# Patient Record
Sex: Male | Born: 1995 | Race: White | Hispanic: Yes | Marital: Single | Smoking: Former smoker
Health system: Southern US, Community
[De-identification: ages and names within clinical notes are randomized; demographics above are authoritative.]

## PROBLEM LIST (undated history)

## (undated) DIAGNOSIS — F429 Obsessive-compulsive disorder, unspecified: Secondary | ICD-10-CM

## (undated) DIAGNOSIS — F419 Anxiety disorder, unspecified: Secondary | ICD-10-CM

---
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
## 2018-01-01 ENCOUNTER — Other Ambulatory Visit: Payer: Self-pay | Admitting: Podiatry

## 2018-01-01 ENCOUNTER — Ambulatory Visit: Payer: BLUE CROSS/BLUE SHIELD | Admitting: Podiatry

## 2018-01-01 ENCOUNTER — Ambulatory Visit (INDEPENDENT_AMBULATORY_CARE_PROVIDER_SITE_OTHER): Payer: BLUE CROSS/BLUE SHIELD

## 2018-01-01 ENCOUNTER — Encounter: Payer: Self-pay | Admitting: Podiatry

## 2018-01-01 VITALS — BP 127/93 | HR 64 | Resp 16

## 2018-01-01 DIAGNOSIS — M25571 Pain in right ankle and joints of right foot: Secondary | ICD-10-CM | POA: Diagnosis not present

## 2018-01-01 DIAGNOSIS — M779 Enthesopathy, unspecified: Secondary | ICD-10-CM

## 2018-01-01 MED ORDER — TRIAMCINOLONE ACETONIDE 10 MG/ML IJ SUSP
10.0000 mg | Freq: Once | INTRAMUSCULAR | Status: AC
  Administered 2018-01-01: 10 mg

## 2018-01-01 NOTE — Progress Notes (Signed)
Subjective:   Patient ID: Adam Vazquez, male   DOB: 22 y.o.   MRN: [id number]   HPI Patient presents stating he has a lot of pain in the outside of the right ankle and he is just moved and is been on his foot for excessive period of time and is started to get sore.  Patient does not smoke and likes to be active   Review of Systems  All other systems reviewed and are negative.       Objective:  Physical Exam  Constitutional: He appears well-developed and well-nourished.  Cardiovascular: Intact distal pulses.  Pulmonary/Chest: Effort normal.  Musculoskeletal: Normal range of motion.  Neurological: He is alert.  Skin: Skin is warm.  Nursing note and vitals reviewed.   Neurovascular status found to be intact muscle strength is adequate range of motion within normal limits with patient noted to have exquisite discomfort in the right sinus tarsi with inflammation within the joint itself with moderate excessive inversion of both feet with ligamentous laxity.  No history of ankle sprain     Assessment:  Probability for sinus tarsitis secondary to excessive activity with ligamentous laxity as possible part of the problem     Plan:  H&P condition reviewed and at this point I did inject the sinus tarsi right 3 mg Kenalog 5 mg Xylocaine and gave instructions for supportive therapy.  Patient may require bracing if symptoms persist but hopefully this will knock his symptoms out and I did place him on oral anti-inflammatory at this time  X-ray indicates no indications of joint damage or indications of arthritis or ligamentous laxity

## 2018-01-01 NOTE — Progress Notes (Signed)
   Subjective:    Patient ID: Adam BurkittLuis Torres-Villa, male    DOB: 07/03/1995, 22 y.o.   MRN: [id number]  HPI    Review of Systems  All other systems reviewed and are negative.      Objective:   Physical Exam        Assessment & Plan:

## 2018-01-12 DIAGNOSIS — F341 Dysthymic disorder: Secondary | ICD-10-CM | POA: Diagnosis not present

## 2018-01-17 DIAGNOSIS — J029 Acute pharyngitis, unspecified: Secondary | ICD-10-CM | POA: Diagnosis not present

## 2018-01-17 DIAGNOSIS — J069 Acute upper respiratory infection, unspecified: Secondary | ICD-10-CM | POA: Diagnosis not present
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
## 2018-04-20 DIAGNOSIS — M25532 Pain in left wrist: Secondary | ICD-10-CM | POA: Diagnosis not present

## 2019-01-27 DIAGNOSIS — F411 Generalized anxiety disorder: Secondary | ICD-10-CM | POA: Diagnosis not present

## 2019-01-27 DIAGNOSIS — F1220 Cannabis dependence, uncomplicated: Secondary | ICD-10-CM | POA: Diagnosis not present

## 2019-01-27 DIAGNOSIS — F331 Major depressive disorder, recurrent, moderate: Secondary | ICD-10-CM | POA: Diagnosis not present

## 2019-02-10 ENCOUNTER — Ambulatory Visit: Payer: Self-pay

## 2019-02-10 ENCOUNTER — Encounter: Payer: Self-pay | Admitting: Orthopaedic Surgery

## 2019-02-10 ENCOUNTER — Ambulatory Visit (INDEPENDENT_AMBULATORY_CARE_PROVIDER_SITE_OTHER): Payer: BC Managed Care – PPO | Admitting: Orthopaedic Surgery

## 2019-02-10 ENCOUNTER — Other Ambulatory Visit: Payer: Self-pay

## 2019-02-10 DIAGNOSIS — M25532 Pain in left wrist: Secondary | ICD-10-CM | POA: Diagnosis not present

## 2019-02-10 MED ORDER — MELOXICAM 15 MG PO TABS: 15.0000 mg | ORAL_TABLET | Freq: Every day | ORAL | 1 refills | Status: DC | PRN
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
## 2019-02-24 DIAGNOSIS — F331 Major depressive disorder, recurrent, moderate: Secondary | ICD-10-CM | POA: Diagnosis not present

## 2019-02-24 DIAGNOSIS — F411 Generalized anxiety disorder: Secondary | ICD-10-CM | POA: Diagnosis not present

## 2019-03-03 DIAGNOSIS — G5603 Carpal tunnel syndrome, bilateral upper limbs: Secondary | ICD-10-CM | POA: Diagnosis not present

## 2019-03-03 DIAGNOSIS — F411 Generalized anxiety disorder: Secondary | ICD-10-CM | POA: Diagnosis not present

## 2019-03-23 DIAGNOSIS — F331 Major depressive disorder, recurrent, moderate: Secondary | ICD-10-CM | POA: Diagnosis not present

## 2019-03-23 DIAGNOSIS — F411 Generalized anxiety disorder: Secondary | ICD-10-CM | POA: Diagnosis not present

## 2019-04-20 DIAGNOSIS — F411 Generalized anxiety disorder: Secondary | ICD-10-CM | POA: Diagnosis not present

## 2019-04-20 DIAGNOSIS — F1220 Cannabis dependence, uncomplicated: Secondary | ICD-10-CM | POA: Diagnosis not present

## 2019-04-20 DIAGNOSIS — F3341 Major depressive disorder, recurrent, in partial remission: Secondary | ICD-10-CM | POA: Diagnosis not present

## 2019-05-13 ENCOUNTER — Ambulatory Visit: Payer: BC Managed Care – PPO | Attending: Family

## 2019-05-13 DIAGNOSIS — Z23 Encounter for immunization: Secondary | ICD-10-CM

## 2019-05-13 NOTE — Progress Notes (Signed)
   Covid-19 Vaccination Clinic  Name:  Atari Novick    MRN: [id number] DOB: 1995-11-24  05/13/2019  Mr. Torres-Villa was observed post Covid-19 immunization for 15 minutes without incident. He was provided with Vaccine Information Sheet and instruction to access the V-Safe system.   Mr. Hoge was instructed to call 911 with any severe reactions post vaccine: Marland Kitchen Difficulty breathing  . Swelling of face and throat  . A fast heartbeat  . A bad rash all over body  . Dizziness and weakness   Immunizations Administered    Name Date Dose VIS Date Route   Moderna COVID-19 Vaccine 05/13/2019 11:28 AM 0.5 mL 01/19/2019 Intramuscular   Manufacturer: Moderna   Lot: [id number]   NDC: 96283-662-94

## 2019-06-03 DIAGNOSIS — F331 Major depressive disorder, recurrent, moderate: Secondary | ICD-10-CM | POA: Diagnosis not present

## 2019-06-03 DIAGNOSIS — F1220 Cannabis dependence, uncomplicated: Secondary | ICD-10-CM | POA: Diagnosis not present

## 2019-06-03 DIAGNOSIS — F411 Generalized anxiety disorder: Secondary | ICD-10-CM | POA: Diagnosis not present

## 2019-06-15 ENCOUNTER — Ambulatory Visit: Payer: BC Managed Care – PPO | Attending: Family

## 2019-06-15 DIAGNOSIS — Z23 Encounter for immunization: Secondary | ICD-10-CM

## 2019-06-15 NOTE — Progress Notes (Signed)
   Covid-19 Vaccination Clinic  Name:  Adam Vazquez    MRN: [id number] DOB: [birth year]  06/15/2019  Mr. Torres-Villa was observed post Covid-19 immunization for 15 minutes without incident. He was provided with Vaccine Information Sheet and instruction to access the V-Safe system.   Mr. Arviso was instructed to call 911 with any severe reactions post vaccine: Marland Kitchen Difficulty breathing  . Swelling of face and throat  . A fast heartbeat  . A bad rash all over body  . Dizziness and weakness   Immunizations Administered    Name Date Dose VIS Date Route   Moderna COVID-19 Vaccine 06/15/2019 10:28 AM 0.5 mL 01/2019 Intramuscular   Manufacturer: Moderna   Lot: [id number]   NDC: 33582-518-98

## 2019-07-02 DIAGNOSIS — F411 Generalized anxiety disorder: Secondary | ICD-10-CM | POA: Diagnosis not present

## 2019-07-02 DIAGNOSIS — F332 Major depressive disorder, recurrent severe without psychotic features: Secondary | ICD-10-CM | POA: Diagnosis not present

## 2019-10-04 ENCOUNTER — Encounter: Payer: Self-pay | Admitting: Physician Assistant

## 2019-10-04 ENCOUNTER — Ambulatory Visit: Payer: BC Managed Care – PPO | Admitting: Physician Assistant

## 2019-10-04 DIAGNOSIS — M7701 Medial epicondylitis, right elbow: Secondary | ICD-10-CM | POA: Diagnosis not present

## 2019-10-04 DIAGNOSIS — M7702 Medial epicondylitis, left elbow: Secondary | ICD-10-CM | POA: Diagnosis not present

## 2019-10-04 NOTE — Progress Notes (Addendum)
Office Visit Note   Patient: Adam Vazquez           Date of Birth: April 04, 1995           MRN: [id number] Visit Date: 10/04/2019              Requested by: No referring provider defined for this encounter. PCP: System, Provider Not In   Assessment & Plan: Visit Diagnoses:  1. Medial epicondylitis of both elbows     Plan: We will send him to physical therapy for modalities and exercises for the bilateral medial epicondylitis.  Also recommend that he try Voltaren gel to the medial aspect of both elbows.  See him back in about 6 weeks to see what type of response he had to therapy. Questions were encouraged and answered at length.  Follow-Up Instructions: No follow-ups on file.   Orders:  No orders of the defined types were placed in this encounter.  No orders of the defined types were placed in this encounter.     Procedures: No procedures performed   Clinical Data: No additional findings.   Subjective: Chief Complaint  Patient presents with  . Left Wrist - Pain  . Right Wrist - Pain  . Right Elbow - Pain  . Left Elbow - Pain    HPI Adam Vazquez is a 24 year old male comes in today with bilateral elbow and wrist pain.  He states that the numbness tingling in his left hand is greatly dissipated and really is not there anymore.  He does have sharp pains in his left hands occasionally with pins and needle sensation particularly involving thumb.  He has had  left elbow pain medial aspect and to a lesser degree in the right medial elbow.  He has had no known injury.  He is taking no medications for this.  He does play musical instruments daily and is tried to cut back on the amount of playing that he does.  He notes that he has wrist pain when playing the piano for long periods of time.  He is also having left second metacarpal pain at times.  Review of Systems Negative for fevers or chills.  Objective: Vital Signs: There were no vitals taken for this visit.  Physical  Exam General: Well-developed well-nourished male in no acute distress. Vascular: Radial pulses are 2+ and equal symmetric. Neurologic: Bilateral hands full sensation throughout the right hand to light touch subjective decreased sensation throughout the median distribution of the left hand.  Full motor bilateral hands. Ortho Exam Bilateral elbows full range of motion without pain.  He has tenderness over the medial epicondyle region of both elbows with negative provocative test bilaterally.  There is no rash skin lesion ulcerations.  Negative Tinel's on the left positive on the right over the median nerve at the wrist.  Compression tenderness positive at the wrist over the median nerve on the right only.  Phalen's test is negative bilaterally.  Specialty Comments:  No specialty comments available.  Imaging: No results found.   PMFS History: There are no problems to display for this patient.  History reviewed. No pertinent past medical history.  History reviewed. No pertinent family history.  History reviewed. No pertinent surgical history. Social History   Occupational History  . Not on file  Tobacco Use  . Smoking status: Former Games developer  . Smokeless tobacco: Never Used  Substance and Sexual Activity  . Alcohol use: No  . Drug use: No  . Sexual activity: Not on  file

## 2019-11-03 DIAGNOSIS — Z130 Encounter for screening for diseases of the blood and blood-forming organs and certain disorders involving the immune mechanism: Secondary | ICD-10-CM | POA: Diagnosis not present

## 2019-11-03 DIAGNOSIS — R718 Other abnormality of red blood cells: Secondary | ICD-10-CM | POA: Diagnosis not present

## 2019-11-03 DIAGNOSIS — Z0000 Encounter for general adult medical examination without abnormal findings: Secondary | ICD-10-CM | POA: Diagnosis not present

## 2019-11-03 DIAGNOSIS — Z0001 Encounter for general adult medical examination with abnormal findings: Secondary | ICD-10-CM | POA: Diagnosis not present

## 2019-11-03 DIAGNOSIS — F329 Major depressive disorder, single episode, unspecified: Secondary | ICD-10-CM | POA: Diagnosis not present

## 2019-11-03 DIAGNOSIS — Z1329 Encounter for screening for other suspected endocrine disorder: Secondary | ICD-10-CM | POA: Diagnosis not present

## 2019-11-03 DIAGNOSIS — F419 Anxiety disorder, unspecified: Secondary | ICD-10-CM | POA: Diagnosis not present

## 2019-11-03 DIAGNOSIS — F410 Panic disorder [episodic paroxysmal anxiety] without agoraphobia: Secondary | ICD-10-CM | POA: Diagnosis not present

## 2019-11-03 DIAGNOSIS — G5602 Carpal tunnel syndrome, left upper limb: Secondary | ICD-10-CM | POA: Diagnosis not present

## 2019-11-15 ENCOUNTER — Ambulatory Visit: Payer: BC Managed Care – PPO | Admitting: Physician Assistant

## 2020-01-11 ENCOUNTER — Ambulatory Visit: Payer: Self-pay

## 2020-01-11 ENCOUNTER — Ambulatory Visit: Payer: BC Managed Care – PPO

## 2020-02-10 DIAGNOSIS — H9203 Otalgia, bilateral: Secondary | ICD-10-CM | POA: Diagnosis not present

## 2020-02-10 DIAGNOSIS — H6983 Other specified disorders of Eustachian tube, bilateral: Secondary | ICD-10-CM | POA: Diagnosis not present

## 2020-04-03 DIAGNOSIS — H93233 Hyperacusis, bilateral: Secondary | ICD-10-CM | POA: Diagnosis not present

## 2020-04-03 DIAGNOSIS — H6123 Impacted cerumen, bilateral: Secondary | ICD-10-CM | POA: Diagnosis not present

## 2020-04-03 DIAGNOSIS — H6983 Other specified disorders of Eustachian tube, bilateral: Secondary | ICD-10-CM | POA: Diagnosis not present

## 2020-04-28 DIAGNOSIS — R590 Localized enlarged lymph nodes: Secondary | ICD-10-CM | POA: Diagnosis not present

## 2020-04-28 DIAGNOSIS — F419 Anxiety disorder, unspecified: Secondary | ICD-10-CM | POA: Diagnosis not present

## 2020-04-28 DIAGNOSIS — R21 Rash and other nonspecific skin eruption: Secondary | ICD-10-CM | POA: Diagnosis not present

## 2020-04-28 DIAGNOSIS — H6983 Other specified disorders of Eustachian tube, bilateral: Secondary | ICD-10-CM | POA: Diagnosis not present

## 2020-05-08 DIAGNOSIS — F419 Anxiety disorder, unspecified: Secondary | ICD-10-CM | POA: Diagnosis not present

## 2020-05-08 DIAGNOSIS — H6983 Other specified disorders of Eustachian tube, bilateral: Secondary | ICD-10-CM | POA: Diagnosis not present

## 2020-05-08 DIAGNOSIS — R590 Localized enlarged lymph nodes: Secondary | ICD-10-CM | POA: Diagnosis not present

## 2020-05-08 DIAGNOSIS — R21 Rash and other nonspecific skin eruption: Secondary | ICD-10-CM | POA: Diagnosis not present

## 2020-05-08 DIAGNOSIS — F331 Major depressive disorder, recurrent, moderate: Secondary | ICD-10-CM | POA: Diagnosis not present

## 2020-05-23 DIAGNOSIS — H6123 Impacted cerumen, bilateral: Secondary | ICD-10-CM | POA: Diagnosis not present

## 2020-05-23 DIAGNOSIS — H938X3 Other specified disorders of ear, bilateral: Secondary | ICD-10-CM | POA: Diagnosis not present

## 2020-05-23 DIAGNOSIS — F419 Anxiety disorder, unspecified: Secondary | ICD-10-CM | POA: Diagnosis not present

## 2020-05-23 DIAGNOSIS — H9203 Otalgia, bilateral: Secondary | ICD-10-CM | POA: Diagnosis not present

## 2020-05-23 DIAGNOSIS — H9313 Tinnitus, bilateral: Secondary | ICD-10-CM | POA: Diagnosis not present

## 2020-08-09 DIAGNOSIS — H9203 Otalgia, bilateral: Secondary | ICD-10-CM | POA: Diagnosis not present

## 2020-08-09 DIAGNOSIS — H608X3 Other otitis externa, bilateral: Secondary | ICD-10-CM | POA: Diagnosis not present

## 2020-08-31 DIAGNOSIS — H9203 Otalgia, bilateral: Secondary | ICD-10-CM | POA: Diagnosis not present

## 2020-08-31 DIAGNOSIS — H608X3 Other otitis externa, bilateral: Secondary | ICD-10-CM | POA: Diagnosis not present

## 2020-09-11 DIAGNOSIS — H9203 Otalgia, bilateral: Secondary | ICD-10-CM | POA: Diagnosis not present

## 2020-09-11 DIAGNOSIS — H9313 Tinnitus, bilateral: Secondary | ICD-10-CM | POA: Diagnosis not present

## 2020-11-14 ENCOUNTER — Ambulatory Visit: Payer: BLUE CROSS/BLUE SHIELD | Admitting: Behavioral Health

## 2020-11-21 DIAGNOSIS — F3341 Major depressive disorder, recurrent, in partial remission: Secondary | ICD-10-CM | POA: Diagnosis not present

## 2020-11-21 DIAGNOSIS — F411 Generalized anxiety disorder: Secondary | ICD-10-CM | POA: Diagnosis not present

## 2020-11-21 DIAGNOSIS — R591 Generalized enlarged lymph nodes: Secondary | ICD-10-CM | POA: Diagnosis not present

## 2020-12-01 DIAGNOSIS — N50811 Right testicular pain: Secondary | ICD-10-CM | POA: Diagnosis not present

## 2020-12-01 DIAGNOSIS — Z681 Body mass index (BMI) 19 or less, adult: Secondary | ICD-10-CM | POA: Diagnosis not present

## 2020-12-03 ENCOUNTER — Emergency Department (HOSPITAL_BASED_OUTPATIENT_CLINIC_OR_DEPARTMENT_OTHER): Payer: BC Managed Care – PPO

## 2020-12-03 ENCOUNTER — Emergency Department (HOSPITAL_BASED_OUTPATIENT_CLINIC_OR_DEPARTMENT_OTHER)
Admission: EM | Admit: 2020-12-03 | Discharge: 2020-12-03 | Disposition: A | Discharge status: Home / Self Care | Payer: BC Managed Care – PPO | Attending: Emergency Medicine | Admitting: Emergency Medicine

## 2020-12-03 ENCOUNTER — Other Ambulatory Visit: Payer: Self-pay

## 2020-12-03 ENCOUNTER — Encounter (HOSPITAL_BASED_OUTPATIENT_CLINIC_OR_DEPARTMENT_OTHER): Payer: Self-pay | Admitting: Obstetrics and Gynecology

## 2020-12-03 DIAGNOSIS — Z87891 Personal history of nicotine dependence: Secondary | ICD-10-CM | POA: Diagnosis not present

## 2020-12-03 DIAGNOSIS — N433 Hydrocele, unspecified: Secondary | ICD-10-CM | POA: Insufficient documentation

## 2020-12-03 DIAGNOSIS — F411 Generalized anxiety disorder: Secondary | ICD-10-CM | POA: Diagnosis not present

## 2020-12-03 DIAGNOSIS — R1030 Lower abdominal pain, unspecified: Secondary | ICD-10-CM | POA: Diagnosis not present

## 2020-12-03 DIAGNOSIS — E876 Hypokalemia: Secondary | ICD-10-CM | POA: Diagnosis not present

## 2020-12-03 DIAGNOSIS — N50811 Right testicular pain: Secondary | ICD-10-CM | POA: Diagnosis not present

## 2020-12-03 DIAGNOSIS — R1031 Right lower quadrant pain: Secondary | ICD-10-CM

## 2020-12-03 DIAGNOSIS — M62838 Other muscle spasm: Secondary | ICD-10-CM | POA: Diagnosis not present

## 2020-12-03 DIAGNOSIS — Z681 Body mass index (BMI) 19 or less, adult: Secondary | ICD-10-CM | POA: Diagnosis not present

## 2020-12-03 DIAGNOSIS — R109 Unspecified abdominal pain: Secondary | ICD-10-CM | POA: Diagnosis not present

## 2020-12-03 HISTORY — DX: Obsessive-compulsive disorder, unspecified: F42.9

## 2020-12-03 HISTORY — DX: Anxiety disorder, unspecified: F41.9

## 2020-12-03 LAB — CBC WITH DIFFERENTIAL/PLATELET
Abs Immature Granulocytes: 0.02 10*3/uL (ref 0.00–0.07)
Basophils Absolute: 0.1 10*3/uL (ref 0.0–0.1)
Basophils Relative: 1 %
Eosinophils Absolute: 0.2 10*3/uL (ref 0.0–0.5)
Eosinophils Relative: 3 %
HCT: 45.9 % (ref 39.0–52.0)
Hemoglobin: 15.1 g/dL (ref 13.0–17.0)
Immature Granulocytes: 0 %
Lymphocytes Relative: 28 %
Lymphs Abs: 2.4 10*3/uL (ref 0.7–4.0)
MCH: 25.8 pg — ABNORMAL LOW (ref 26.0–34.0)
MCHC: 32.9 g/dL (ref 30.0–36.0)
MCV: 78.3 fL — ABNORMAL LOW (ref 80.0–100.0)
Monocytes Absolute: 0.6 10*3/uL (ref 0.1–1.0)
Monocytes Relative: 7 %
Neutro Abs: 5.1 10*3/uL (ref 1.7–7.7)
Neutrophils Relative %: 61 %
Platelets: 189 10*3/uL (ref 150–400)
RBC: 5.86 MIL/uL — ABNORMAL HIGH (ref 4.22–5.81)
RDW: 13.2 % (ref 11.5–15.5)
WBC: 8.4 10*3/uL (ref 4.0–10.5)
nRBC: 0 % (ref 0.0–0.2)

## 2020-12-03 LAB — COMPREHENSIVE METABOLIC PANEL
ALT: 11 U/L (ref 0–44)
AST: 18 U/L (ref 15–41)
Albumin: 5.1 g/dL — ABNORMAL HIGH (ref 3.5–5.0)
Alkaline Phosphatase: 45 U/L (ref 38–126)
Anion gap: 12 (ref 5–15)
BUN: 14 mg/dL (ref 6–20)
CO2: 24 mmol/L (ref 22–32)
Calcium: 9.9 mg/dL (ref 8.9–10.3)
Chloride: 105 mmol/L (ref 98–111)
Creatinine, Ser: 0.8 mg/dL (ref 0.61–1.24)
GFR, Estimated: >60 mL/min (ref >60)
Glucose, Bld: 99 mg/dL (ref 70–99)
Potassium: 3.4 mmol/L — ABNORMAL LOW (ref 3.5–5.1)
Sodium: 141 mmol/L (ref 135–145)
Total Bilirubin: 0.5 mg/dL (ref 0.3–1.2)
Total Protein: 7.6 g/dL (ref 6.5–8.1)

## 2020-12-03 LAB — URINALYSIS, ROUTINE W REFLEX MICROSCOPIC
Bilirubin Urine: NEGATIVE
Glucose, UA: NEGATIVE mg/dL
Hgb urine dipstick: NEGATIVE
Ketones, ur: NEGATIVE mg/dL
Leukocytes,Ua: NEGATIVE
Nitrite: NEGATIVE
Protein, ur: NEGATIVE mg/dL
Specific Gravity, Urine: 1.018 (ref 1.005–1.030)
pH: 8.5 — ABNORMAL HIGH (ref 5.0–8.0)

## 2020-12-03 MED ORDER — IOHEXOL 300 MG/ML  SOLN
100.0000 mL | Freq: Once | INTRAMUSCULAR | Status: AC | PRN
  Administered 2020-12-03: 100 mL via INTRAVENOUS

## 2020-12-03 NOTE — ED Provider Notes (Signed)
[facility] EMERGENCY DEPT Provider Note   CSN: [id number] Arrival date & time: 12/03/20  1355     History Chief Complaint  Patient presents with   Groin Pain    Adam Vazquez is a 25 y.o. male.  The history is provided by the patient and medical records. No language interpreter was used.  Groin Pain This is a new problem. The current episode started more than 2 days ago. The problem occurs constantly. The problem has not changed since onset.Associated symptoms include abdominal pain. Pertinent negatives include no chest pain, no headaches and no shortness of breath. The symptoms are aggravated by bending (mvoving). Nothing relieves the symptoms. He has tried nothing for the symptoms. The treatment provided no relief.      Past Medical History:  Diagnosis Date   Anxiety    OCD (obsessive compulsive disorder)     There are no problems to display for this patient.   History reviewed. No pertinent surgical history.     No family history on file.  Social History   Tobacco Use   Smoking status: Former   Smokeless tobacco: Never  Building services engineer Use: Never used  Substance Use Topics   Alcohol use: No   Drug use: Yes    Types: Marijuana    Home Medications Prior to Admission medications   Medication Sig Start Date End Date Taking? Authorizing Provider  sertraline (ZOLOFT) 100 MG tablet Take 100 mg by mouth daily. 06/02/19   [provider]    Allergies    Patient has no known allergies.  Review of Systems   Review of Systems  Constitutional:  Negative for chills, diaphoresis, fatigue and fever.  HENT:  Negative for congestion.   Respiratory:  Negative for cough, chest tightness, shortness of breath and wheezing.   Cardiovascular:  Negative for chest pain, palpitations and leg swelling.  Gastrointestinal:  Positive for abdominal pain. Negative for constipation, diarrhea, nausea and vomiting.  Genitourinary:  Positive  for testicular pain. Negative for decreased urine volume, dysuria, flank pain, penile discharge, penile pain, penile swelling and scrotal swelling.  Musculoskeletal:  Negative for back pain, neck pain and neck stiffness.  Skin:  Negative for rash and wound.  Neurological:  Negative for headaches.  Psychiatric/Behavioral:  Negative for agitation and confusion.   All other systems reviewed and are negative.  Physical Exam Updated Vital Signs BP 130/71 (BP Location: Right Arm)   Pulse (!) 115   Temp 98.7 F (37.1 C)   Resp 16   SpO2 100%   Physical Exam Vitals and nursing note reviewed. Exam conducted with a chaperone present.  Constitutional:      General: He is not in acute distress.    Appearance: He is well-developed. He is not ill-appearing, toxic-appearing or diaphoretic.  HENT:     Head: Normocephalic and atraumatic.     Nose: No congestion or rhinorrhea.  Eyes:     Conjunctiva/sclera: Conjunctivae normal.  Cardiovascular:     Rate and Rhythm: Normal rate and regular rhythm.     Heart sounds: No murmur heard. Pulmonary:     Effort: Pulmonary effort is normal. No respiratory distress.     Breath sounds: Normal breath sounds. No wheezing, rhonchi or rales.  Chest:     Chest wall: No tenderness.  Abdominal:     General: Abdomen is flat.     Palpations: Abdomen is soft.     Tenderness: There is abdominal tenderness. There is no right  CVA tenderness, left CVA tenderness, guarding or rebound.  Genitourinary:    Testes:        Right: Tenderness present.        Left: Tenderness not present.       Comments: Patient has some right inguinal tenderness but no evidence of hernia on exam.  Some tenderness in the right scrotum but no erythema or warmth.  Normal mobility of the testicles.  Some tenderness in the right lower quadrant. Musculoskeletal:        General: No tenderness.     Cervical back: Neck supple. No tenderness.     Right lower leg: No edema.     Left lower leg: No  edema.  Skin:    General: Skin is warm and dry.     Capillary Refill: Capillary refill takes less than 2 seconds.     Findings: No erythema.  Neurological:     General: No focal deficit present.     Mental Status: He is alert.     Sensory: No sensory deficit.     Motor: No weakness.    ED Results / Procedures / Treatments   Labs (all labs ordered are listed, but only abnormal results are displayed) Labs Reviewed  URINALYSIS, ROUTINE W REFLEX MICROSCOPIC - Abnormal; Notable for the following components:      Result Value   pH 8.5 (*)    All other components within normal limits  CBC WITH DIFFERENTIAL/PLATELET - Abnormal; Notable for the following components:   RBC 5.86 (*)    MCV 78.3 (*)    MCH 25.8 (*)    All other components within normal limits  COMPREHENSIVE METABOLIC PANEL - Abnormal; Notable for the following components:   Potassium 3.4 (*)    Albumin 5.1 (*)    All other components within normal limits    EKG None  Radiology CT ABDOMEN PELVIS W CONTRAST  Result Date: 12/03/2020 CLINICAL DATA:  RLQ abdominal pain Right groin and right lower quadrant abdominal pain. Ultrasound showed no torsion but mild hydroceles. Family concerned about appendicitis. EXAM: CT ABDOMEN AND PELVIS WITH CONTRAST TECHNIQUE: Multidetector CT imaging of the abdomen and pelvis was performed using the standard protocol following bolus administration of intravenous contrast. CONTRAST:  OMNIPAQUE IOHEXOL 300 MG/ML  SOLN COMPARISON:  None. FINDINGS: Lower chest: Included lung bases are clear.  Heart size is normal. Hepatobiliary: No focal liver abnormality is seen. No gallstones, gallbladder wall thickening, or biliary dilatation. Pancreas: Unremarkable. No pancreatic ductal dilatation or surrounding inflammatory changes. Spleen: Normal in size without focal abnormality. Adrenals/Urinary Tract: Unremarkable adrenal glands. Kidneys enhance symmetrically without focal lesion, stone, or  hydronephrosis. Ureters are nondilated. Urinary bladder appears unremarkable. Stomach/Bowel: Stomach is within normal limits. Appendix appears normal (series 5, images 45-47). No evidence of bowel wall thickening, distention, or inflammatory changes. Vascular/Lymphatic: No significant vascular findings are present. No enlarged abdominal or pelvic lymph nodes. Reproductive: Prostate is unremarkable. Other: No free fluid. No abdominopelvic fluid collection. No pneumoperitoneum. No abdominal wall hernia. Musculoskeletal: Incidentally noted benign bone islands within the left femoral neck and right sacrum. Bony structures are otherwise within normal limits. No acute fracture or malalignment. No suspicious bone lesion. IMPRESSION: No acute abdominopelvic findings. Normal appendix. Electronically Signed   By: Duanne Guess D.O.   On: 12/03/2020 17:28   US SCROTUM W/DOPPLER  Result Date: 12/03/2020 CLINICAL DATA:  Right inguinal pain. EXAM: SCROTAL ULTRASOUND DOPPLER ULTRASOUND OF THE TESTICLES TECHNIQUE: Complete ultrasound examination of the testicles, epididymis, and  other scrotal structures was performed. Color and spectral Doppler ultrasound were also utilized to evaluate blood flow to the testicles. COMPARISON:  None. FINDINGS: Right testicle Measurements: 4.7 x 2.1 x 3.5 cm. No mass or microlithiasis visualized. Left testicle Measurements: 4.2 x 1.6 x 2.9 cm. No mass or microlithiasis visualized. Right epididymis:  Normal in size and appearance. Left epididymis:  Normal in size and appearance. Hydrocele:  Trace bilateral hydroceles. Varicocele:  None visualized. Pulsed Doppler interrogation of both testes demonstrates normal low resistance arterial and venous waveforms bilaterally. Targeted ultrasound performed of the right inguinal canal at the site of pain demonstrates no definite abnormality. IMPRESSION: Trace bilateral hydroceles. Otherwise unremarkable scrotal ultrasound. Electronically Signed   By:  Emmaline Kluver M.D.   On: 12/03/2020 14:48    Procedures Procedures   Medications Ordered in ED Medications  iohexol (OMNIPAQUE) 300 MG/ML solution 100 mL (100 mLs Intravenous Contrast Given 12/03/20 1704)    ED Course  I have reviewed the triage vital signs and the nursing notes.  Pertinent labs & imaging results that were available during my care of the patient were reviewed by me and considered in my medical decision making (see chart for details).    MDM Rules/Calculators/A&P                           Adam Vazquez is a 26 y.o. male with a past medical history significant for anxiety and documented obsessive-compulsive disorder who presents with groin pain.  Patient reports that he started having right-sided inguinal and groin pain 5 days ago after masturbating 3 times and 6 hours.  He reports this is more than normal for him.  He denies any dysuria or hematuria and has never had this pain before.  He reports pain in his right testicle, right inguinal area, and right lower quadrant.  He has no history of trauma or hernias.  No rashes or skin changes reported.  No fevers, chills, chest pain, shortness breath, nausea, vomiting, constipation, or diarrhea.  Pain is moderate to severe especially when he moves or bends differently.  On exam, lungs clear and chest nontender.  Abdomen was tender in the right lower quadrant and right inguinal area.  I did not appreciate a hernia when examined with a chaperone.  Mild testicular tenderness on the right side but no warmth.  Normal mobility of the testicle.  Penis was uncircumcised and nontender.  Exam otherwise unremarkable.  Patient had an ultrasound which did not show torsion.  Small hydrocele seen.  Otherwise due to the right lower quadrant tenderness we agreed to get a CT scan to look for hernia missed on ultrasound or appendicitis.  Labs only showed mild hypokalemia was otherwise reassuring.  CT scan did not show  appendicitis or other abnormality.  Suspect that the patient's masturbatory episodes on the day the discomfort started may have led to the hydrocele.  This may also be causing some of the discomfort.  Patient will follow-up with urology and observe conservative management.  He agrees with plan of care and discharge and was discharged in good condition.   Final Clinical Impression(s) / ED Diagnoses Final diagnoses:  Pain in right testicle  Hydrocele, unspecified hydrocele type  Right inguinal pain    Rx / DC Orders ED Discharge Orders     None       Clinical Impression: 1. Hydrocele, unspecified hydrocele type   2. Pain in right testicle   3.  Right inguinal pain     Disposition: Discharge  Condition: Good  I have discussed the results, Dx and Tx plan with the pt(& family if present). He/she/they expressed understanding and agree(s) with the plan. Discharge instructions discussed at great length. Strict return precautions discussed and pt &/or family have verbalized understanding of the instructions. No further questions at time of discharge.    New Prescriptions   No medications on file    Follow Up: [facility] [address] [place] [address] [phone]       Audrea Bolte, Canary Brim, MD 12/03/20 [phone]

## 2020-12-03 NOTE — ED Notes (Signed)
Dc instructions reviewed with patient , voiced understanding.

## 2020-12-03 NOTE — Discharge Instructions (Addendum)
Your history, exam, work-up today are consistent with hydrocele causing pain in your groin.  There is no evidence of hernia, torsion, infection, or appendicitis on the ultrasound and CT scan.  Your labs are otherwise reassuring as we discussed.  Please follow-up with urology and PCP.  If any symptoms change or worsen, please return to the nearest emergency department.

## 2020-12-03 NOTE — ED Notes (Signed)
RT placed PIV in right [facility] without difficulty. Blood collection performed at this time for labs. PIC secured, flushed, and saline locked.

## 2020-12-03 NOTE — ED Triage Notes (Signed)
Patient reports to the ER for groin pain, and reports pain in the right testicle. Patient states he has not had sex in x4 years. Patient reports he had not masturbated in x4 days and then masturbated x3 times in under 6 hours. Patient reports he also worked out and did leg workouts and since then has developed pain in his right groin. Patient also reports some nausea.

## 2020-12-15 DIAGNOSIS — N433 Hydrocele, unspecified: Secondary | ICD-10-CM | POA: Diagnosis not present

## 2020-12-15 DIAGNOSIS — R1031 Right lower quadrant pain: Secondary | ICD-10-CM | POA: Diagnosis not present

## 2020-12-15 DIAGNOSIS — F32A Depression, unspecified: Secondary | ICD-10-CM | POA: Diagnosis not present

## 2020-12-20 DIAGNOSIS — M79604 Pain in right leg: Secondary | ICD-10-CM | POA: Diagnosis not present

## 2020-12-20 DIAGNOSIS — N432 Other hydrocele: Secondary | ICD-10-CM | POA: Diagnosis not present

## 2020-12-20 DIAGNOSIS — R1031 Right lower quadrant pain: Secondary | ICD-10-CM | POA: Diagnosis not present

## 2021-01-18 DIAGNOSIS — F411 Generalized anxiety disorder: Secondary | ICD-10-CM | POA: Diagnosis not present

## 2021-01-18 DIAGNOSIS — F3341 Major depressive disorder, recurrent, in partial remission: Secondary | ICD-10-CM | POA: Diagnosis not present

## 2021-01-18 DIAGNOSIS — J320 Chronic maxillary sinusitis: Secondary | ICD-10-CM | POA: Diagnosis not present

## 2021-01-23 DIAGNOSIS — H93293 Other abnormal auditory perceptions, bilateral: Secondary | ICD-10-CM | POA: Diagnosis not present

## 2021-02-20 DIAGNOSIS — J309 Allergic rhinitis, unspecified: Secondary | ICD-10-CM | POA: Diagnosis not present

## 2021-04-04 DIAGNOSIS — R1031 Right lower quadrant pain: Secondary | ICD-10-CM | POA: Diagnosis not present

## 2021-04-04 DIAGNOSIS — N433 Hydrocele, unspecified: Secondary | ICD-10-CM | POA: Diagnosis not present

## 2021-04-04 DIAGNOSIS — M79604 Pain in right leg: Secondary | ICD-10-CM | POA: Diagnosis not present

## 2021-04-04 DIAGNOSIS — N5312 Painful ejaculation: Secondary | ICD-10-CM | POA: Diagnosis not present

## 2021-04-05 DIAGNOSIS — M25552 Pain in left hip: Secondary | ICD-10-CM | POA: Diagnosis not present

## 2021-04-05 DIAGNOSIS — M25541 Pain in joints of right hand: Secondary | ICD-10-CM | POA: Diagnosis not present

## 2021-04-05 DIAGNOSIS — Z6820 Body mass index (BMI) 20.0-20.9, adult: Secondary | ICD-10-CM | POA: Diagnosis not present

## 2021-04-05 DIAGNOSIS — M545 Low back pain, unspecified: Secondary | ICD-10-CM | POA: Diagnosis not present

## 2021-04-30 DIAGNOSIS — F1220 Cannabis dependence, uncomplicated: Secondary | ICD-10-CM | POA: Diagnosis not present

## 2021-04-30 DIAGNOSIS — F32A Depression, unspecified: Secondary | ICD-10-CM | POA: Diagnosis not present

## 2021-05-02 DIAGNOSIS — F411 Generalized anxiety disorder: Secondary | ICD-10-CM | POA: Diagnosis not present

## 2021-05-02 DIAGNOSIS — F331 Major depressive disorder, recurrent, moderate: Secondary | ICD-10-CM | POA: Diagnosis not present

## 2021-05-02 DIAGNOSIS — F429 Obsessive-compulsive disorder, unspecified: Secondary | ICD-10-CM | POA: Diagnosis not present

## 2021-05-02 DIAGNOSIS — F1220 Cannabis dependence, uncomplicated: Secondary | ICD-10-CM | POA: Diagnosis not present

## 2021-06-04 DIAGNOSIS — R102 Pelvic and perineal pain: Secondary | ICD-10-CM | POA: Diagnosis not present

## 2021-06-04 DIAGNOSIS — N4281 Prostatodynia syndrome: Secondary | ICD-10-CM | POA: Diagnosis not present

## 2021-08-10 DIAGNOSIS — M6289 Other specified disorders of muscle: Secondary | ICD-10-CM | POA: Diagnosis not present

## 2021-08-10 DIAGNOSIS — R300 Dysuria: Secondary | ICD-10-CM | POA: Diagnosis not present

## 2021-08-10 DIAGNOSIS — F419 Anxiety disorder, unspecified: Secondary | ICD-10-CM | POA: Diagnosis not present

## 2021-09-25 DIAGNOSIS — K5900 Constipation, unspecified: Secondary | ICD-10-CM | POA: Diagnosis not present

## 2021-09-25 DIAGNOSIS — R1030 Lower abdominal pain, unspecified: Secondary | ICD-10-CM | POA: Diagnosis not present

## 2021-09-27 DIAGNOSIS — R102 Pelvic and perineal pain: Secondary | ICD-10-CM | POA: Diagnosis not present

## 2021-09-27 DIAGNOSIS — N4281 Prostatodynia syndrome: Secondary | ICD-10-CM | POA: Diagnosis not present

## 2021-10-04 DIAGNOSIS — S300XXA Contusion of lower back and pelvis, initial encounter: Secondary | ICD-10-CM | POA: Diagnosis not present

## 2022-01-15 DIAGNOSIS — F1220 Cannabis dependence, uncomplicated: Secondary | ICD-10-CM | POA: Diagnosis not present

## 2022-01-15 DIAGNOSIS — F411 Generalized anxiety disorder: Secondary | ICD-10-CM | POA: Diagnosis not present

## 2022-01-15 DIAGNOSIS — R002 Palpitations: Secondary | ICD-10-CM | POA: Diagnosis not present

## 2022-01-22 ENCOUNTER — Ambulatory Visit: Payer: Self-pay | Admitting: *Deleted

## 2022-01-22 DIAGNOSIS — M25552 Pain in left hip: Secondary | ICD-10-CM | POA: Diagnosis not present

## 2022-01-22 NOTE — Telephone Encounter (Signed)
  Chief Complaint: forceful heartbeats- feels like a hammer Symptoms: heart beat is forceful- HR 50, patient is on propranolol and hydroxyzine for heart rate control, patient does suffer from anxiety and panic attacks with physical symptoms  Frequency: 1-2 weeks Pertinent Negatives: Patient denies chest pain, SOB Disposition: [] ED /[] Urgent Care (no appt availability in office) / [] Appointment(In office/virtual)/ []  [place] [facility]/ [] Home Care/ [] Refused Recommended Disposition /[] [place] [facility]/ [x]  Follow-up with PCP Additional Notes: Patient does have appointment with PCP tomorrow- he wants to be seen today- he is going to ED   Reason for Disposition  [1] Palpitations AND [2] no improvement after using Care Advice  Answer Assessment - Initial Assessment Questions 1. DESCRIPTION: "Please describe your heart rate or heartbeat that you are having" (e.g., fast/slow, regular/irregular, skipped or extra beats, "palpitations")     " Heavy heartbeat"- hammer feeling, tremor 2. ONSET: "When did it start?" (Minutes, hours or days)      Last Friday- almost 2 weeks 3. DURATION: "How long does it last" (e.g., seconds, minutes, hours)     Constant- Sunday it stopped 4. PATTERN "Does it come and go, or has it been constant since it started?"  "Does it get worse with exertion?"   "Are you feeling it now?"     Constant, worse when sitting still, now 5. TAP: "Using your hand, can you tap out what you are feeling on a chair or table in front of you, so that I can hear?" (Note: not all patients can do this)       regular 6. HEART RATE: "Can you tell me your heart rate?" "How many beats in 15 seconds?"  (Note: not all patients can do this)       unsure 7. RECURRENT SYMPTOM: "Have you ever had this before?" If Yes, ask: "When was the last time?" and "What happened that time?"      Under treatment- started medication 8. CAUSE: "What do you think is causing the palpitations?"     Panic  attack 9. CARDIAC HISTORY: "Do you have any history of heart disease?" (e.g., heart attack, angina, bypass surgery, angioplasty, arrhythmia)      unsure 10. OTHER SYMPTOMS: "Do you have any other symptoms?" (e.g., dizziness, chest pain, sweating, difficulty breathing)       Anxiety-causes sharp pain- not now 11. PREGNANCY: "Is there any chance you are pregnant?" "When was your last menstrual period?"       na  Protocols used: Heart Rate and Heartbeat Questions-A-AH

## 2022-01-24 DIAGNOSIS — F429 Obsessive-compulsive disorder, unspecified: Secondary | ICD-10-CM | POA: Diagnosis not present

## 2022-01-25 DIAGNOSIS — F429 Obsessive-compulsive disorder, unspecified: Secondary | ICD-10-CM | POA: Diagnosis not present

## 2022-01-28 DIAGNOSIS — F1220 Cannabis dependence, uncomplicated: Secondary | ICD-10-CM | POA: Diagnosis not present

## 2022-01-28 DIAGNOSIS — F411 Generalized anxiety disorder: Secondary | ICD-10-CM | POA: Diagnosis not present

## 2022-01-28 DIAGNOSIS — Z681 Body mass index (BMI) 19 or less, adult: Secondary | ICD-10-CM | POA: Diagnosis not present

## 2022-01-29 DIAGNOSIS — F429 Obsessive-compulsive disorder, unspecified: Secondary | ICD-10-CM | POA: Diagnosis not present

## 2022-01-30 DIAGNOSIS — F429 Obsessive-compulsive disorder, unspecified: Secondary | ICD-10-CM | POA: Diagnosis not present

## 2022-01-31 DIAGNOSIS — F429 Obsessive-compulsive disorder, unspecified: Secondary | ICD-10-CM | POA: Diagnosis not present

## 2022-02-01 DIAGNOSIS — F429 Obsessive-compulsive disorder, unspecified: Secondary | ICD-10-CM | POA: Diagnosis not present

## 2022-02-04 DIAGNOSIS — F429 Obsessive-compulsive disorder, unspecified: Secondary | ICD-10-CM | POA: Diagnosis not present

## 2022-02-06 DIAGNOSIS — F429 Obsessive-compulsive disorder, unspecified: Secondary | ICD-10-CM | POA: Diagnosis not present

## 2022-02-07 DIAGNOSIS — R251 Tremor, unspecified: Secondary | ICD-10-CM | POA: Diagnosis not present

## 2022-02-07 DIAGNOSIS — M25552 Pain in left hip: Secondary | ICD-10-CM | POA: Diagnosis not present

## 2022-02-07 DIAGNOSIS — R002 Palpitations: Secondary | ICD-10-CM | POA: Diagnosis not present

## 2022-02-07 DIAGNOSIS — F411 Generalized anxiety disorder: Secondary | ICD-10-CM | POA: Diagnosis not present

## 2022-02-08 DIAGNOSIS — F429 Obsessive-compulsive disorder, unspecified: Secondary | ICD-10-CM | POA: Diagnosis not present

## 2022-02-12 DIAGNOSIS — F429 Obsessive-compulsive disorder, unspecified: Secondary | ICD-10-CM | POA: Diagnosis not present

## 2022-02-13 DIAGNOSIS — F429 Obsessive-compulsive disorder, unspecified: Secondary | ICD-10-CM | POA: Diagnosis not present

## 2022-02-14 DIAGNOSIS — F429 Obsessive-compulsive disorder, unspecified: Secondary | ICD-10-CM | POA: Diagnosis not present

## 2022-02-19 DIAGNOSIS — F429 Obsessive-compulsive disorder, unspecified: Secondary | ICD-10-CM | POA: Diagnosis not present

## 2022-02-22 DIAGNOSIS — F429 Obsessive-compulsive disorder, unspecified: Secondary | ICD-10-CM | POA: Diagnosis not present

## 2022-02-25 DIAGNOSIS — F429 Obsessive-compulsive disorder, unspecified: Secondary | ICD-10-CM | POA: Diagnosis not present

## 2022-02-26 DIAGNOSIS — F429 Obsessive-compulsive disorder, unspecified: Secondary | ICD-10-CM | POA: Diagnosis not present

## 2022-02-28 DIAGNOSIS — J029 Acute pharyngitis, unspecified: Secondary | ICD-10-CM | POA: Diagnosis not present

## 2022-03-01 DIAGNOSIS — K219 Gastro-esophageal reflux disease without esophagitis: Secondary | ICD-10-CM | POA: Diagnosis not present

## 2022-03-01 DIAGNOSIS — F429 Obsessive-compulsive disorder, unspecified: Secondary | ICD-10-CM | POA: Diagnosis not present

## 2022-03-04 DIAGNOSIS — K219 Gastro-esophageal reflux disease without esophagitis: Secondary | ICD-10-CM | POA: Diagnosis not present

## 2022-03-05 DIAGNOSIS — F429 Obsessive-compulsive disorder, unspecified: Secondary | ICD-10-CM | POA: Diagnosis not present

## 2022-03-06 DIAGNOSIS — F429 Obsessive-compulsive disorder, unspecified: Secondary | ICD-10-CM | POA: Diagnosis not present

## 2022-03-07 DIAGNOSIS — F429 Obsessive-compulsive disorder, unspecified: Secondary | ICD-10-CM | POA: Diagnosis not present

## 2022-03-08 DIAGNOSIS — F429 Obsessive-compulsive disorder, unspecified: Secondary | ICD-10-CM | POA: Diagnosis not present

## 2022-03-11 DIAGNOSIS — F429 Obsessive-compulsive disorder, unspecified: Secondary | ICD-10-CM | POA: Diagnosis not present

## 2022-03-13 DIAGNOSIS — F429 Obsessive-compulsive disorder, unspecified: Secondary | ICD-10-CM | POA: Diagnosis not present

## 2022-03-14 DIAGNOSIS — F429 Obsessive-compulsive disorder, unspecified: Secondary | ICD-10-CM | POA: Diagnosis not present

## 2022-03-15 DIAGNOSIS — F429 Obsessive-compulsive disorder, unspecified: Secondary | ICD-10-CM | POA: Diagnosis not present

## 2022-05-01 DIAGNOSIS — M79645 Pain in left finger(s): Secondary | ICD-10-CM | POA: Diagnosis not present

## 2022-05-03 ENCOUNTER — Ambulatory Visit
Admission: RE | Admit: 2022-05-03 | Discharge: 2022-05-03 | Disposition: A | Discharge status: Home / Self Care | Payer: Self-pay | Source: Ambulatory Visit | Attending: Physician Assistant | Admitting: Physician Assistant

## 2022-05-03 ENCOUNTER — Other Ambulatory Visit: Payer: Self-pay | Admitting: Physician Assistant

## 2022-05-03 ENCOUNTER — Encounter: Payer: Self-pay | Admitting: Physician Assistant

## 2022-05-03 DIAGNOSIS — M79645 Pain in left finger(s): Secondary | ICD-10-CM | POA: Diagnosis not present

## 2022-06-10 IMAGING — US US SCROTUM W/ DOPPLER COMPLETE
1 series · 14 of 25 positions shown · non-contrast
Comparison: None.

CLINICAL DATA: Right inguinal pain.

EXAM:
SCROTAL ULTRASOUND
DOPPLER ULTRASOUND OF THE TESTICLES
TECHNIQUE: Complete ultrasound examination of the testicles, epididymis, and
other scrotal structures was performed. Color and spectral Doppler
ultrasound were also utilized to evaluate blood flow to the
testicles.

[Series 1: us scrotum w/doppler · 14 of 87 slices shown]
[im 1/87]
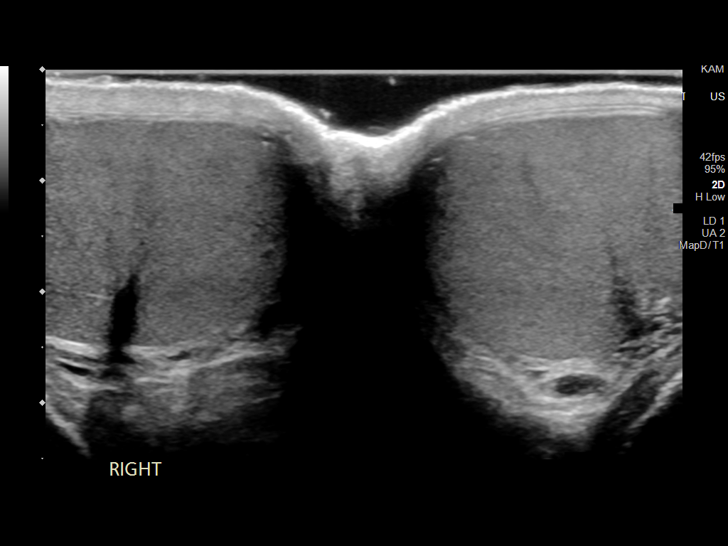
[im 8/87]
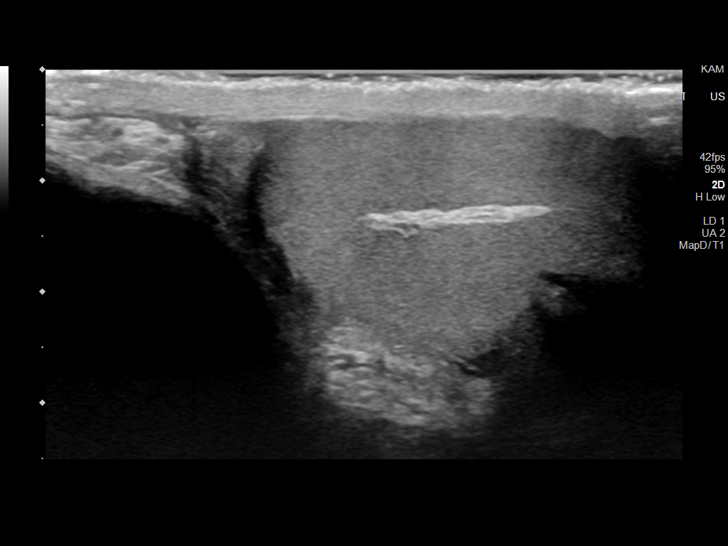
[im 15/87]
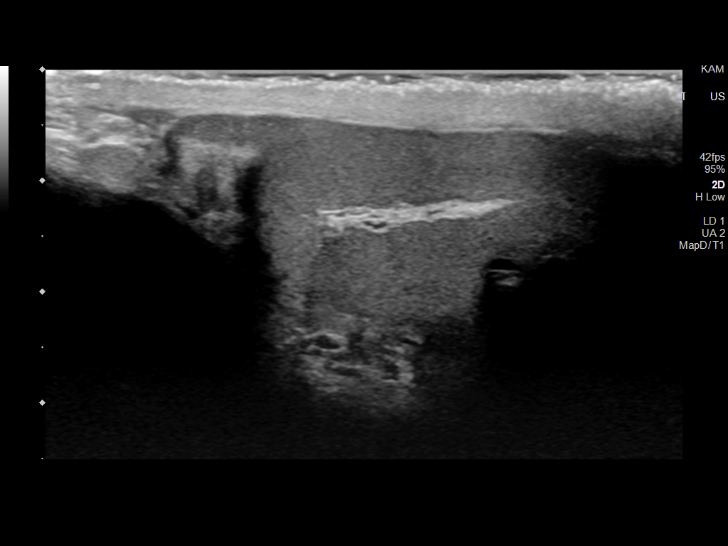
[im 22/87]
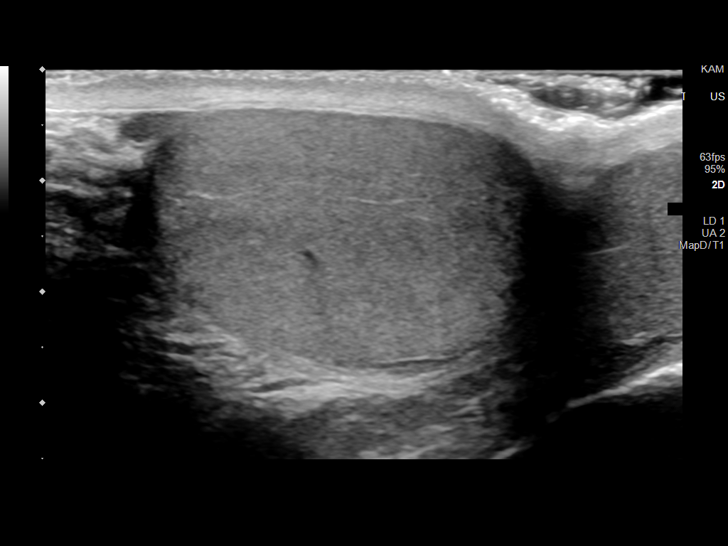
[im 29/87]
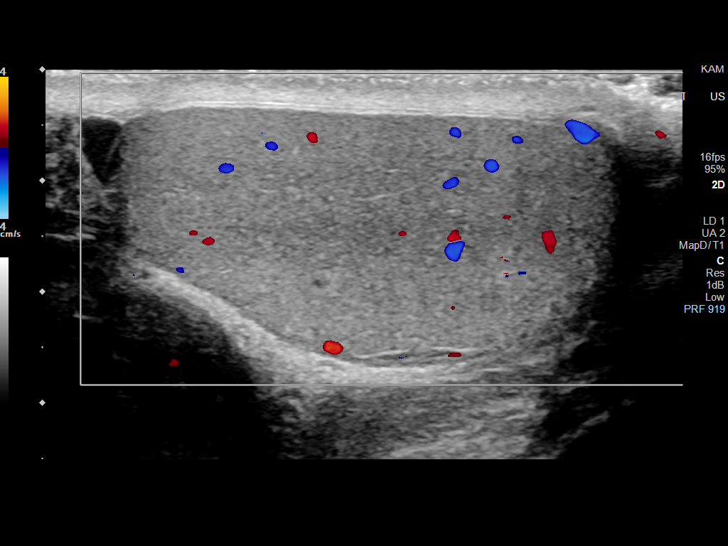
[im 33/87]
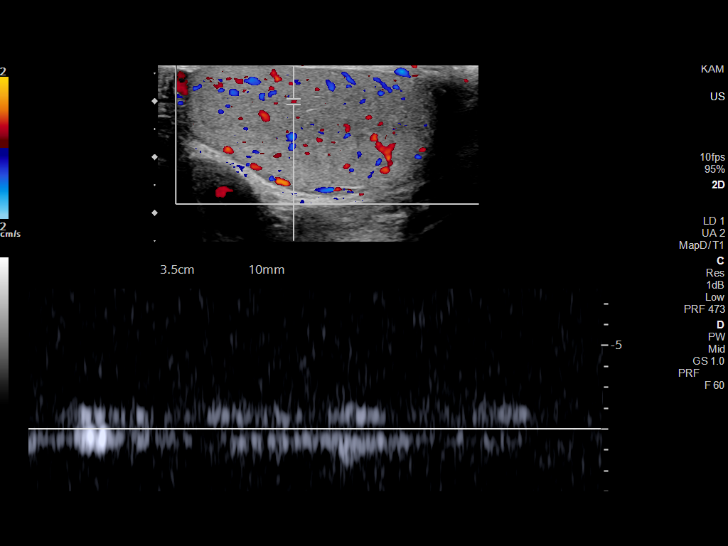
[im 40/87]
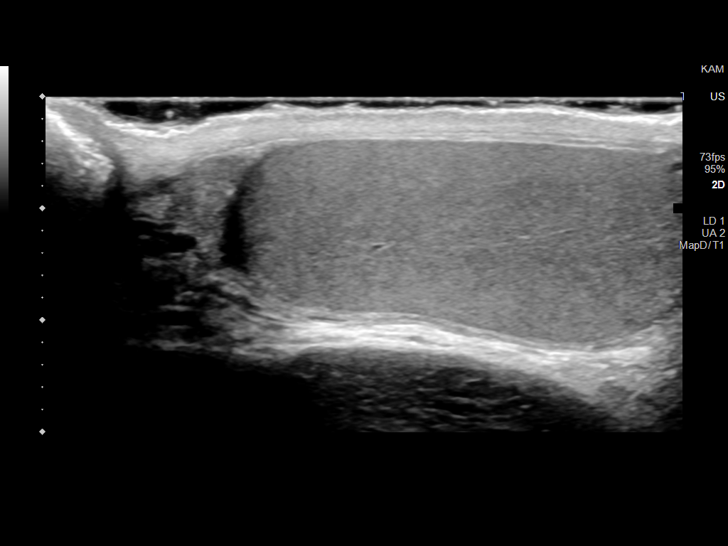
[im 47/87]
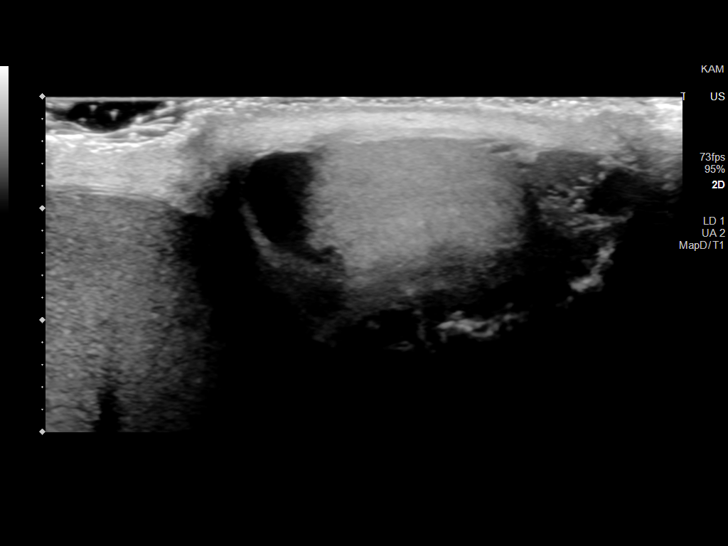
[im 54/87]
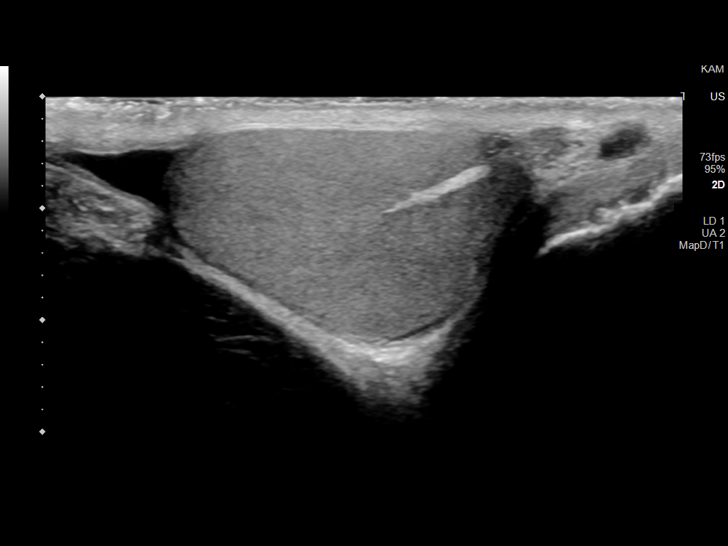
[im 58/87]
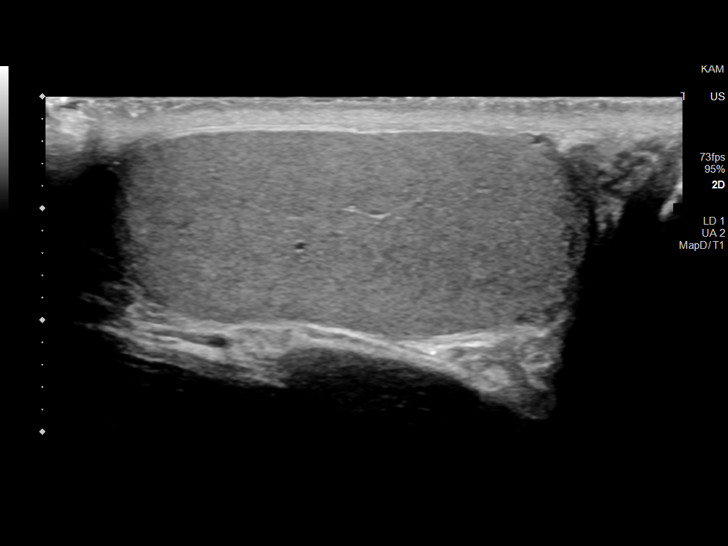
[im 65/87]
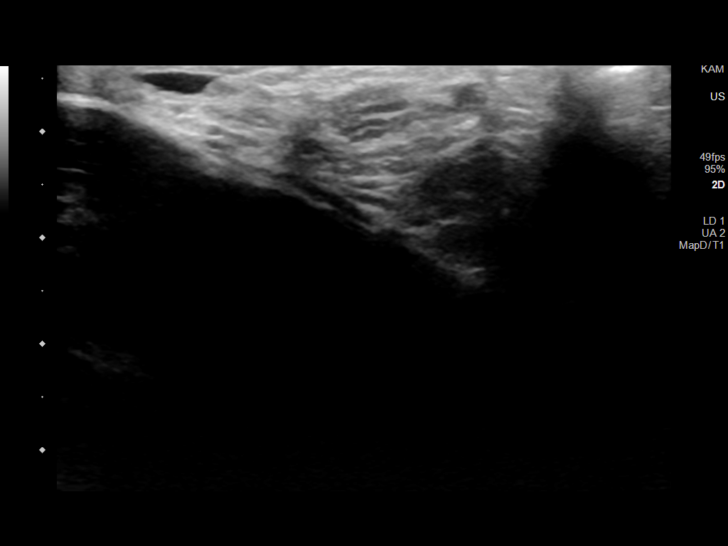
[im 72/87]
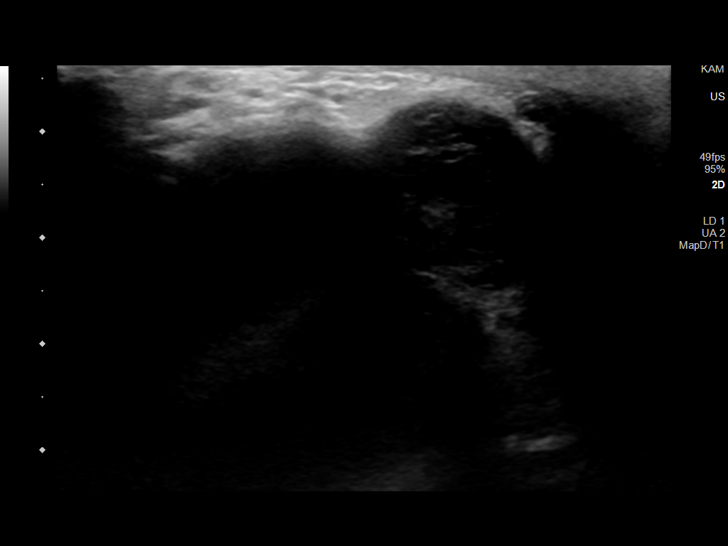
[im 79/87]
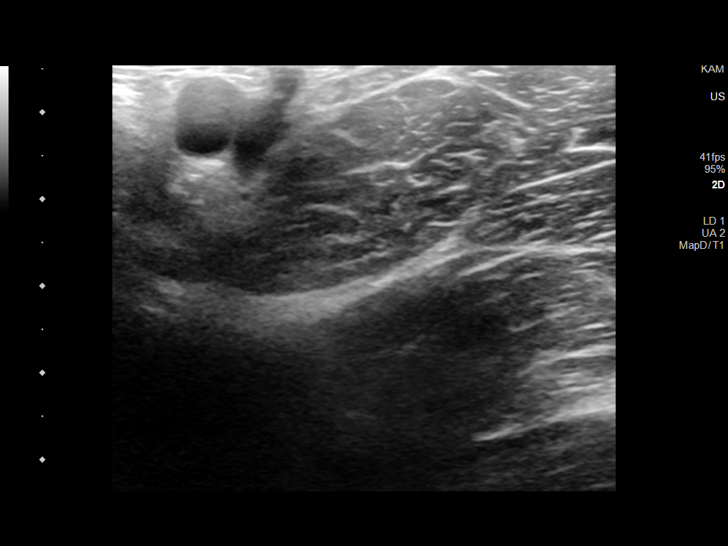
[im 87/87]
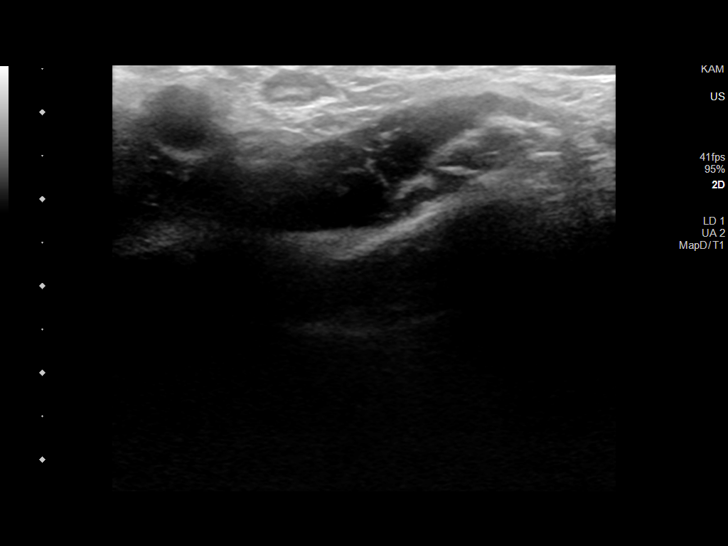

[14 of 25 positions shown; findings below may reference images not displayed]

FINDINGS: Right testicle

Measurements: 4.7 x 2.1 x 3.5 cm. No mass or microlithiasis
visualized.

Left testicle

Measurements: 4.2 x 1.6 x 2.9 cm. No mass or microlithiasis
visualized.

Right epididymis:  Normal in size and appearance.

Left epididymis:  Normal in size and appearance.

Hydrocele:  Trace bilateral hydroceles.

Varicocele:  None visualized.

Pulsed Doppler interrogation of both testes demonstrates normal low
resistance arterial and venous waveforms bilaterally.

Targeted ultrasound performed of the right inguinal canal at the
site of pain demonstrates no definite abnormality.
IMPRESSION: Trace bilateral hydroceles. Otherwise unremarkable scrotal
ultrasound.

## 2022-06-27 DIAGNOSIS — Z23 Encounter for immunization: Secondary | ICD-10-CM | POA: Diagnosis not present

## 2022-06-27 DIAGNOSIS — Z13220 Encounter for screening for lipoid disorders: Secondary | ICD-10-CM | POA: Diagnosis not present

## 2022-06-27 DIAGNOSIS — Z0000 Encounter for general adult medical examination without abnormal findings: Secondary | ICD-10-CM | POA: Diagnosis not present

## 2022-08-30 DIAGNOSIS — R1030 Lower abdominal pain, unspecified: Secondary | ICD-10-CM | POA: Diagnosis not present

## 2022-09-18 DIAGNOSIS — F411 Generalized anxiety disorder: Secondary | ICD-10-CM | POA: Diagnosis not present

## 2022-10-19 DIAGNOSIS — F411 Generalized anxiety disorder: Secondary | ICD-10-CM | POA: Diagnosis not present

## 2022-11-18 DIAGNOSIS — F411 Generalized anxiety disorder: Secondary | ICD-10-CM | POA: Diagnosis not present

## 2022-12-27 DIAGNOSIS — F40243 Fear of flying: Secondary | ICD-10-CM | POA: Diagnosis not present

## 2022-12-27 DIAGNOSIS — F429 Obsessive-compulsive disorder, unspecified: Secondary | ICD-10-CM | POA: Diagnosis not present

## 2022-12-27 DIAGNOSIS — F411 Generalized anxiety disorder: Secondary | ICD-10-CM | POA: Diagnosis not present

## 2022-12-28 ENCOUNTER — Emergency Department (HOSPITAL_BASED_OUTPATIENT_CLINIC_OR_DEPARTMENT_OTHER)
Admission: EM | Admit: 2022-12-28 | Discharge: 2022-12-28 | Discharge status: Against Medical Advice | Payer: BC Managed Care – PPO | Attending: Emergency Medicine | Admitting: Emergency Medicine

## 2022-12-28 ENCOUNTER — Other Ambulatory Visit: Payer: Self-pay

## 2022-12-28 ENCOUNTER — Encounter (HOSPITAL_BASED_OUTPATIENT_CLINIC_OR_DEPARTMENT_OTHER): Payer: Self-pay | Admitting: Emergency Medicine

## 2022-12-28 DIAGNOSIS — F410 Panic disorder [episodic paroxysmal anxiety] without agoraphobia: Secondary | ICD-10-CM | POA: Insufficient documentation

## 2022-12-28 DIAGNOSIS — Z5321 Procedure and treatment not carried out due to patient leaving prior to being seen by health care provider: Secondary | ICD-10-CM | POA: Insufficient documentation

## 2022-12-28 NOTE — ED Notes (Signed)
Pt called x3,not in lobby

## 2022-12-28 NOTE — ED Triage Notes (Signed)
Pt reports a panic attack x 3 days intermittently but consistent since 1500, pt reports hx of same, takes propanolol and hydroxyzine, pt reports he has not taken his meds today due to being intoxicated, denies HI/SI; pt very anxious in triage

## 2023-03-20 DIAGNOSIS — F410 Panic disorder [episodic paroxysmal anxiety] without agoraphobia: Secondary | ICD-10-CM | POA: Diagnosis not present

## 2023-03-20 DIAGNOSIS — J069 Acute upper respiratory infection, unspecified: Secondary | ICD-10-CM | POA: Diagnosis not present

## 2023-07-25 DIAGNOSIS — S80861A Insect bite (nonvenomous), right lower leg, initial encounter: Secondary | ICD-10-CM | POA: Diagnosis not present

## 2023-09-16 DIAGNOSIS — S46912A Strain of unspecified muscle, fascia and tendon at shoulder and upper arm level, left arm, initial encounter: Secondary | ICD-10-CM | POA: Diagnosis not present

## 2023-09-19 DIAGNOSIS — S43002D Unspecified subluxation of left shoulder joint, subsequent encounter: Secondary | ICD-10-CM | POA: Diagnosis not present

## 2023-09-19 DIAGNOSIS — Z681 Body mass index (BMI) 19 or less, adult: Secondary | ICD-10-CM | POA: Diagnosis not present
